# Patient Record
Sex: Female | Born: 2005
Health system: Southern US, Community
[De-identification: ages and names within clinical notes are randomized; demographics above are authoritative.]

## PROBLEM LIST (undated history)

## (undated) DIAGNOSIS — M25569 Pain in unspecified knee: Secondary | ICD-10-CM

## (undated) DIAGNOSIS — Z973 Presence of spectacles and contact lenses: Secondary | ICD-10-CM

## (undated) DIAGNOSIS — G90A Postural orthostatic tachycardia syndrome (POTS): Secondary | ICD-10-CM

## (undated) DIAGNOSIS — E739 Lactose intolerance, unspecified: Secondary | ICD-10-CM

## (undated) HISTORY — DX: Pain in unspecified knee: M25.569

## (undated) HISTORY — DX: Lactose intolerance, unspecified: E73.9

## (undated) HISTORY — DX: Presence of spectacles and contact lenses: Z97.3

## (undated) HISTORY — PX: OTHER SURGICAL HISTORY: SHX169

---
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
## 2023-02-14 ENCOUNTER — Encounter (HOSPITAL_BASED_OUTPATIENT_CLINIC_OR_DEPARTMENT_OTHER): Payer: Self-pay | Admitting: Emergency Medicine

## 2023-02-14 ENCOUNTER — Telehealth: Payer: Medicaid Other | Admitting: Physician Assistant

## 2023-02-14 ENCOUNTER — Emergency Department (HOSPITAL_BASED_OUTPATIENT_CLINIC_OR_DEPARTMENT_OTHER)
Admission: EM | Admit: 2023-02-14 | Discharge: 2023-02-14 | Disposition: A | Discharge status: Home / Self Care | Payer: Medicaid Other | Attending: Emergency Medicine | Admitting: Emergency Medicine

## 2023-02-14 ENCOUNTER — Other Ambulatory Visit (HOSPITAL_BASED_OUTPATIENT_CLINIC_OR_DEPARTMENT_OTHER): Payer: Self-pay

## 2023-02-14 ENCOUNTER — Other Ambulatory Visit: Payer: Self-pay

## 2023-02-14 ENCOUNTER — Ambulatory Visit: Payer: Self-pay | Admitting: Family Medicine

## 2023-02-14 DIAGNOSIS — L259 Unspecified contact dermatitis, unspecified cause: Secondary | ICD-10-CM | POA: Insufficient documentation

## 2023-02-14 DIAGNOSIS — J069 Acute upper respiratory infection, unspecified: Secondary | ICD-10-CM | POA: Insufficient documentation

## 2023-02-14 DIAGNOSIS — R0989 Other specified symptoms and signs involving the circulatory and respiratory systems: Secondary | ICD-10-CM

## 2023-02-14 DIAGNOSIS — Z20822 Contact with and (suspected) exposure to covid-19: Secondary | ICD-10-CM | POA: Insufficient documentation

## 2023-02-14 DIAGNOSIS — Z91010 Allergy to peanuts: Secondary | ICD-10-CM | POA: Diagnosis not present

## 2023-02-14 DIAGNOSIS — R21 Rash and other nonspecific skin eruption: Secondary | ICD-10-CM

## 2023-02-14 DIAGNOSIS — R509 Fever, unspecified: Secondary | ICD-10-CM

## 2023-02-14 LAB — GROUP A STREP BY PCR: Group A Strep by PCR: NOT DETECTED

## 2023-02-14 LAB — RESP PANEL BY RT-PCR (RSV, FLU A&B, COVID)  RVPGX2
Influenza A by PCR: NEGATIVE
Influenza B by PCR: NEGATIVE
Resp Syncytial Virus by PCR: NEGATIVE
SARS Coronavirus 2 by RT PCR: NEGATIVE

## 2023-02-14 MED ORDER — CLOBETASOL PROPIONATE 0.05 % EX CREA
1.0000 | TOPICAL_CREAM | Freq: Two times a day (BID) | CUTANEOUS | 0 refills | Status: AC
  Filled 2023-02-14: qty 30, 30d supply, fill #0

## 2023-02-14 NOTE — Discharge Instructions (Signed)
Thank you for allowing me to be a part of your care today.  You were evaluated in the ED for a rash on your hand.  I suspect that this is an eczematous contact dermatitis caused by the gloves that you use at work.  I have prescribed you a prescription ointment to use twice daily.  Throughout the day, I recommend handwashing with warm water and gentle soaps and applying a topical hand cream that creates a barrier such as Eucerin or Aquaphor.  At nighttime, you may continue to apply the Aveeno eczema lotion.  Do not apply these creams or lotions at the same time as the ointment.  Avoid hot water, chemical irritants, hand sanitizer, or wearing gloves until your skin is improved.  Follow-up with your PCP if symptoms persist.

## 2023-02-14 NOTE — ED Notes (Signed)
RT Note: Patient appears to be in no distress at this time. BBS clear and she is moving good air through out the lungs. SPO2 100% RA

## 2023-02-14 NOTE — ED Triage Notes (Signed)
Rash to right hand since christmas eve.  Pt has been using gloves with powder at work.  Pt also having some sneezing with some throat irritation.  Airway patent, no redness or irritation noted.  No swelling of the throat noted.  No known fever.

## 2023-02-14 NOTE — Telephone Encounter (Signed)
Copied from CRM [id number]. Topic: Clinical - Red Word Triage >> Feb 14, 2023 11:49 AM Rose Lang wrote: Red Word that prompted transfer to Nurse Triage: Allergic reaction on both hands, right hand is worse, PT has low-grade fever and URI symptoms  Chief Complaint: rash to top of hands Symptoms: rash, fever, pain Frequency: constant Pertinent Negatives: Patient denies sob, n/v Disposition: [] ED /[x] Urgent Care (no appt availability in office) / [] Appointment(In office/virtual)/ []  [place] [facility]/ [] Home Care/ [] Refused Recommended Disposition /[]  Mobile Bus/ []  Follow-up with PCP Additional Notes: called in with c/o rash, unknow start.  Reports feels it is an allergic reaction.  No apt available with pcp.  Patient states cannot wait until tomorrow.  Instructed to go to UC and to ER if becomes worse.   Reason for Disposition  [1] Rash is painful to touch AND [2] fever  Answer Assessment - Initial Assessment Questions 1. APPEARANCE of RASH: "Describe the rash."      Blotchy and blisters, tiny hives 2. LOCATION: "Where is the rash located?"      Both hands. On top of hand near knuckles 4. SIZE: "How big are the spots?" (Inches, centimeters or compare to size of a coin)      Pin head size 5. ONSET: "When did the rash start?"      A few days ago 6. ITCHING: "Does the rash itch?" If Yes, ask: "How bad is the itch?"  (Scale 0-10; or none, mild, moderate, severe)     Yes, itches 7. PAIN: "Does the rash hurt?" If Yes, ask: "How bad is the pain?"  (Scale 0-10; or none, mild, moderate, severe)    - NONE (0): no pain    - MILD (1-3): doesn't interfere with normal activities     - MODERATE (4-7): interferes with normal activities or awakens from sleep     - SEVERE (8-10): excruciating pain, unable to do any normal activities     7-8/10 pain and itchy 8. OTHER SYMPTOMS: "Do you have any other symptoms?" (e.g., fever)     Low grade fever; temp was 99.5  Protocols used: Rash or  Redness - Localized-A-AH

## 2023-02-14 NOTE — ED Provider Notes (Signed)
[facility] Provider Note   CSN: [id number] Arrival date & time: 02/14/23  1228     History  Chief Complaint  Patient presents with   Rash   URI    Rose Lang is a 17 y.o. female without significant past medical history presents to the ED complaining of a rash to her right hand since Christmas eve.  Patient states that she has been using vinyl gloves with powder at work.  Patient has used these clubs in the past, but has not had severe irritation like this before.  Patient has then stopped using gloves with powder, but the rash persists.  Patient has had redness and irritation on her hands previously due to glove use.  She also began having some sneezing and throat irritation that began today, but is unsure if this is related.  Patient has been using over-the-counter topical treatments including Benadryl, Aveeno eczema, Lotrimin, Aspercreme, and hydrocortisone.  Patient reports that the rash will become hot and burn at times, especially with hot water.  Denies throat swelling, facial swelling, fever.       Home Medications Prior to Admission medications   Medication Sig Start Date End Date Taking? Authorizing Provider  clobetasol ointment (TEMOVATE) 0.05 % Apply 1 Application topically 2 (two) times daily. 02/14/23  Yes Powell Halbert R, PA-C  diphenhydrAMINE (BENADRYL) 25 MG tablet Take 25 mg by mouth every 6 (six) hours as needed for itching, allergies or sleep.    [provider]  EPINEPHrine 0.3 mg/0.3 mL IJ SOAJ injection Inject 0.3 mg into the muscle as needed for anaphylaxis. 06/23/22   Hetty Blend, FNP  lactase (LACTAID) 3000 units tablet Take 3,000 Units by mouth 3 (three) times daily with meals.    [provider]  norelgestromin-ethinyl estradiol Burr Medico) 150-35 MCG/24HR transdermal patch Place 1 patch onto the skin once a week. 07/30/21   Rolm Bookbinder, CNM  ondansetron (ZOFRAN) 4 MG tablet Take one  tablet (4 mg dose) by mouth daily as needed for Nausea. Patient taking differently: Take 4 mg by mouth as needed for nausea. 07/29/21         Allergies    Shellfish allergy, Lactose, Peanut oil, and Wheat    Review of Systems   Review of Systems  Constitutional:  Negative for fever.  HENT:  Positive for sneezing. Negative for facial swelling and trouble swallowing.   Skin:  Positive for rash.    Physical Exam Updated Vital Signs BP 132/75   Pulse 82   Temp 98.4 F (36.9 C) (Oral)   Resp 16   Ht 5\' 3"  (1.6 m)   Wt 67.1 kg   LMP 01/08/2023   SpO2 100%   BMI 26.22 kg/m  Physical Exam Vitals and nursing note reviewed.  Constitutional:      General: She is not in acute distress.    Appearance: Normal appearance. She is not ill-appearing or diaphoretic.  HENT:     Nose: No congestion or rhinorrhea.  Cardiovascular:     Rate and Rhythm: Normal rate and regular rhythm.  Pulmonary:     Effort: Pulmonary effort is normal.  Skin:    General: Skin is warm and dry.     Capillary Refill: Capillary refill takes less than 2 seconds.     Findings: Rash present. Rash is macular, papular and scaling.     Comments: Dry, scaly maculopapular rash to the dorsum of the right hand mainly over the thumb  and index finger.  Rash appears eczematous in nature.  No weeping, open wounds.  Neurological:     Mental Status: She is alert. Mental status is at baseline.  Psychiatric:        Mood and Affect: Mood normal.        Behavior: Behavior normal.     ED Results / Procedures / Treatments   Labs (all labs ordered are listed, but only abnormal results are displayed) Labs Reviewed  GROUP A STREP BY PCR  RESP PANEL BY RT-PCR (RSV, FLU A&B, COVID)  RVPGX2    EKG None  Radiology No results found.  Procedures Procedures    Medications Ordered in ED Medications - No data to display  ED Course/ Medical Decision Making/ A&P                                 Medical Decision  Making  This patient presents to the ED with chief complaint(s) of rash to right hand.  The complaint involves an extensive differential diagnosis and also carries with it a high risk of complications and morbidity.    The differential diagnosis includes contact dermatitis, eczema, allergic reaction  Initial Assessment:   Exam significant for a dry, scaly maculopapular rash to the dorsum of the right hand mainly over the thumb and index finger.  She has normal ROM of all digits on this hand.  Rash appears eczematous in nature.  There is no weeping or open wounds.  No congestion or rhinorrhea on exam.  Patient is speaking in full sentences does not have any difficulty breathing.  Independent ECG/labs interpretation:  The following labs were independently interpreted:  Negative for strep pharyngitis, COVID, influenza, RSV.  Disposition:   Suspect patient has an eczematous contact dermatitis caused by glove use.  Patient has experienced irritation to her hands in the past that seems to be worsening.  Will prescribe clobetasol ointment for treatment.  Advised patient she may continue to use Aveeno eczema cream to help with symptoms.  Advised patient to also wash hands with warm soapy water, not hot, and apply hand creams or Aquaphor to create a skin barrier.  Recommended that patient not use gloves until her rash improves.  Also recommended taking antihistamine such as Zyrtec or Xyzal.  Work note provided.    The patient has been appropriately medically screened and/or stabilized in the ED. I have low suspicion for any other emergent medical condition which would require further screening, evaluation or treatment in the ED or require inpatient management. At time of discharge the patient is hemodynamically stable and in no acute distress. I have discussed work-up results and diagnosis with patient and answered all questions. Patient is agreeable with discharge plan. We discussed strict return precautions  for returning to the emergency department and they verbalized understanding.            Final Clinical Impression(s) / ED Diagnoses Final diagnoses:  Contact eczematous dermatitis    Rx / DC Orders ED Discharge Orders          Ordered    clobetasol ointment (TEMOVATE) 0.05 %  2 times daily        02/14/23 1548              Lenard Simmer, PA-C 02/14/23 1556    Charlynne Pander, MD 02/14/23 [phone]

## 2023-02-14 NOTE — ED Notes (Signed)
Patient denies pain and is resting comfortably.  

## 2023-02-14 NOTE — Progress Notes (Signed)
Because you are having URI symptoms and low grade fever with a new onset rash, I feel your condition warrants further evaluation and I recommend that you be seen in a face to face visit.   NOTE: There will be NO CHARGE for this eVisit   If you are having a true medical emergency please call 911.      For an urgent face to face visit, [facility] has eight urgent care centers for your convenience:   NEW!! [facility] Center at [facility] Get Driving Directions [phone] [address] [place], [address]    [facility] at [facility] Get Driving Directions [phone] [address] [place], [place] [address]   [facility] Health Urgent Care Center [facility]) Get Driving Directions [phone] [address] [place], [place] [address]  [facility] Health Urgent Care Center [facility]) Get Driving Directions [phone] [address] [place],  [place]  [address]  [facility] Urgent Care Center [facility] - at [employer]  [phone] [address] [place],  [place] [address]   [facility] Health Urgent Care at [facility] Get Driving Directions [phone] [address] [place], [place] [address]   [facility] Health Urgent Care at [facility] Get Driving Directions  [phone] [address] [place], [place] [address]   [facility] Health Urgent Care at [facility] Directions [phone] [address] [place], [place] [address]  Your MyChart E-visit questionnaire answers were reviewed by a board certified advanced clinical practitioner to complete your personal care plan based on your specific symptoms.  Thank you for using e-Visits.     I have spent 5 minutes in review of e-visit questionnaire, review and updating patient chart, medical decision making and response to patient.   Margaretann Loveless, PA-C

## 2023-08-17 ENCOUNTER — Encounter: Payer: Self-pay | Admitting: Family Medicine

## 2023-08-17 ENCOUNTER — Ambulatory Visit (INDEPENDENT_AMBULATORY_CARE_PROVIDER_SITE_OTHER): Payer: Self-pay | Admitting: Family Medicine

## 2023-08-17 VITALS — BP 108/70 | HR 66 | Ht 63.6 in | Wt 142.9 lb

## 2023-08-17 DIAGNOSIS — Z13220 Encounter for screening for lipoid disorders: Secondary | ICD-10-CM | POA: Diagnosis not present

## 2023-08-17 DIAGNOSIS — K582 Mixed irritable bowel syndrome: Secondary | ICD-10-CM

## 2023-08-17 DIAGNOSIS — R55 Syncope and collapse: Secondary | ICD-10-CM

## 2023-08-17 DIAGNOSIS — Z0000 Encounter for general adult medical examination without abnormal findings: Secondary | ICD-10-CM | POA: Diagnosis not present

## 2023-08-17 DIAGNOSIS — E162 Hypoglycemia, unspecified: Secondary | ICD-10-CM

## 2023-08-17 NOTE — Assessment & Plan Note (Signed)
 Well adult Orders Placed This Encounter  Procedures   CMP14+EGFR   CBC with Differential/Platelet   Lipid panel   TSH   Ambulatory referral to Gastroenterology    Referral Priority:   Routine    Referral Type:   Consultation    Referral Reason:   Specialty Services Required    Number of Visits Requested:   1  Screenings: Per lab orders Immunizations: UTD Anticipatory guidance/Risk factor reduction: Recommendations per AVS.

## 2023-08-17 NOTE — Patient Instructions (Signed)
Preventive Care 18-21 Years Old, Female Preventive care refers to lifestyle choices and visits with your health care provider that can promote health and wellness. At this stage in your life, you may start seeing a primary care physician instead of a pediatrician for your preventive care. Preventive care visits are also called wellness exams. What can I expect for my preventive care visit? Counseling During your preventive care visit, your health care provider may ask about your: Medical history, including: Past medical problems. Family medical history. Pregnancy history. Current health, including: Menstrual cycle. Method of birth control. Emotional well-being. Home life and relationship well-being. Sexual activity and sexual health. Lifestyle, including: Alcohol, nicotine or tobacco, and drug use. Access to firearms. Diet, exercise, and sleep habits. Sunscreen use. Motor vehicle safety. Physical exam Your health care provider may check your: Height and weight. These may be used to calculate your BMI (body mass index). BMI is a measurement that tells if you are at a healthy weight. Waist circumference. This measures the distance around your waistline. This measurement also tells if you are at a healthy weight and may help predict your risk of certain diseases, such as type 2 diabetes and high blood pressure. Heart rate and blood pressure. Body temperature. Skin for abnormal spots. Breasts. What immunizations do I need?  Vaccines are usually given at various ages, according to a schedule. Your health care provider will recommend vaccines for you based on your age, medical history, and lifestyle or other factors, such as travel or where you work. What tests do I need? Screening Your health care provider may recommend screening tests for certain conditions. This may include: Vision and hearing tests. Lipid and cholesterol levels. Pelvic exam and Pap test. Hepatitis B  test. Hepatitis C test. HIV (human immunodeficiency virus) test. STI (sexually transmitted infection) testing, if you are at risk. Tuberculosis skin test if you have symptoms. BRCA-related cancer screening. This may be done if you have a family history of breast, ovarian, tubal, or peritoneal cancers. Talk with your health care provider about your test results, treatment options, and if necessary, the need for more tests. Follow these instructions at home: Eating and drinking  Eat a healthy diet that includes fresh fruits and vegetables, whole grains, lean protein, and low-fat dairy products. Drink enough fluid to keep your urine pale yellow. Do not drink alcohol if: Your health care provider tells you not to drink. You are pregnant, may be pregnant, or are planning to become pregnant. You are under the legal drinking age. In the [place] the legal drinking age is 21. If you drink alcohol: Limit how much you have to 0-1 drink a day. Know how much alcohol is in your drink. In the [place] one drink equals one 12 oz bottle of beer (355 mL), one 5 oz glass of wine (148 mL), or one 1 oz glass of hard liquor (44 mL). Lifestyle Brush your teeth every morning and night with fluoride toothpaste. Floss one time each day. Exercise for at least 30 minutes 5 or more days of the week. Do not use any products that contain nicotine or tobacco. These products include cigarettes, chewing tobacco, and vaping devices, such as e-cigarettes. If you need help quitting, ask your health care provider. Do not use drugs. If you are sexually active, practice safe sex. Use a condom or other form of protection to prevent STIs. If you do not wish to become pregnant, use a form of birth control. If you plan to become pregnant,   see your health care provider for a prepregnancy visit. Find healthy ways to manage stress, such as: Meditation, yoga, or listening to music. Journaling. Talking to a trusted person. Spending time  with friends and family. Safety Always wear your seat belt while driving or riding in a vehicle. Do not drive: If you have been drinking alcohol. Do not ride with someone who has been drinking. When you are tired or distracted. While texting. If you have been using any mind-altering substances or drugs. Wear a helmet and other protective equipment during sports activities. If you have firearms in your house, make sure you follow all gun safety procedures. Seek help if you have been bullied, physically abused, or sexually abused. Use the internet responsibly to avoid dangers, such as online bullying and online sex predators. What's next? Go to your health care provider once a year for an annual wellness visit. Ask your health care provider how often you should have your eyes and teeth checked. Stay up to date on all vaccines. This information is not intended to replace advice given to you by your health care provider. Make sure you discuss any questions you have with your health care provider. Document Revised: 07/30/2020 Document Reviewed: 07/30/2020 Elsevier Patient Education  2024 Elsevier Inc.  

## 2023-08-17 NOTE — Progress Notes (Signed)
 Rose Lang - 18 y.o. female MRN [id number]  Date of birth: [birth year]  Subjective Chief Complaint  Patient presents with   Annual Exam   Irritable Bowel Syndrome   Chest Pain    HPI Rose Lang is a 18 y.o. female here today for annual exam.   History of IBS and vasovagal syncope.  Seen by cardiology in the past.    She is moderately active.  She feels that he diet is pretty good but has to avoid certain foods.   She is a  non-smoker. She denies EtOH use.   Review of Systems  Constitutional:  Negative for chills, fever, malaise/fatigue and weight loss.  HENT:  Negative for congestion, ear pain and sore throat.   Eyes:  Negative for blurred vision, double vision and pain.  Respiratory:  Negative for cough and shortness of breath.   Cardiovascular:  Negative for chest pain and palpitations.  Gastrointestinal:  Negative for abdominal pain, blood in stool, constipation, heartburn and nausea.  Genitourinary:  Negative for dysuria and urgency.  Musculoskeletal:  Negative for joint pain and myalgias.  Neurological:  Negative for dizziness and headaches.  Endo/Heme/Allergies:  Does not bruise/bleed easily.  Psychiatric/Behavioral:  Negative for depression. The patient is not nervous/anxious and does not have insomnia.     Allergies  Allergen Reactions   Shellfish Allergy Anaphylaxis   Lactose Diarrhea   Peanut Oil Swelling    Throat closed up   Wheat Nausea And Vomiting    Stomach pain    Past Medical History:  Diagnosis Date   Knee pain    Lactose intolerance    Wears glasses     Past Surgical History:  Procedure Laterality Date   NONE      Social History   Socioeconomic History   Marital status: Single    Spouse name: Not on file   Number of children: Not on file   Years of education: Not on file   Highest education level: Not on file  Occupational History   Occupation: Control and instrumentation engineer: [employer]  Tobacco Use   Smoking status: Never     Passive exposure: Never   Smokeless tobacco: Never  Vaping Use   Vaping status: Never Used  Substance and Sexual Activity   Alcohol use: No   Drug use: No   Sexual activity: Never  Other Topics Concern   Not on file  Social History Narrative   Not on file   Social Drivers of Health   Financial Resource Strain: Not on file  Food Insecurity: Not on file  Transportation Needs: Not on file  Physical Activity: Sufficiently Active (08/17/2023)   Exercise Vital Sign    Days of Exercise per Week: 5 days    Minutes of Exercise per Session: 150+ min  Stress: Not on file  Social Connections: Unknown (07/08/2021)   [facility]   Social Network    Social Network: Not on file    Family History  Problem Relation Age of Onset   Arthritis Mother    Atrial fibrillation Mother    Supraventricular tachycardia Mother    High Cholesterol Father    Heart disease Maternal Grandmother    Heart attack Maternal Grandmother    Heart disease Maternal Grandfather    Allergic rhinitis Neg Hx    Angioedema Neg Hx    Asthma Neg Hx    Eczema Neg Hx    Immunodeficiency Neg Hx    Urticaria Neg Hx  Atopy Neg Hx     Health Maintenance  Topic Date Due   HIV Screening  Never done   Meningococcal B Vaccine (1 of 2 - Standard) Never done   Hepatitis C Screening  Never done   COVID-19 Vaccine (1 - 2024-25 season) Never done   INFLUENZA VACCINE  09/16/2023   DTaP/Tdap/Td (7 - Td or Tdap) 09/25/2026   Pneumococcal Vaccine 39-72 Years old  Completed   Hepatitis B Vaccines  Completed   HPV VACCINES  Completed     ----------------------------------------------------------------------------------------------------------------------------------------------------------------------------------------------------------------- Physical Exam BP 108/70 (BP Location: Left Arm, Patient Position: Sitting, Cuff Size: Normal)   Pulse 66   Ht 5' 3.6 (1.615 m)   Wt 142 lb 14.4 oz (64.8 kg)   LMP  08/01/2023 (Exact Date)   SpO2 97%   BMI 24.84 kg/m   Physical Exam Constitutional:      General: She is not in acute distress. HENT:     Head: Normocephalic and atraumatic.     Right Ear: Tympanic membrane and ear canal normal.     Left Ear: Tympanic membrane and ear canal normal.     Nose: Nose normal.  Eyes:     General: No scleral icterus.    Conjunctiva/sclera: Conjunctivae normal.  Neck:     Thyroid: No thyromegaly.  Cardiovascular:     Rate and Rhythm: Normal rate and regular rhythm.     Heart sounds: Normal heart sounds.  Pulmonary:     Effort: Pulmonary effort is normal.     Breath sounds: Normal breath sounds.  Abdominal:     General: Bowel sounds are normal. There is no distension.     Palpations: Abdomen is soft.     Tenderness: There is no abdominal tenderness. There is no guarding.  Musculoskeletal:        General: Normal range of motion.     Cervical back: Normal range of motion and neck supple.  Lymphadenopathy:     Cervical: No cervical adenopathy.  Skin:    General: Skin is warm and dry.     Findings: No rash.  Neurological:     General: No focal deficit present.     Mental Status: She is alert and oriented to person, place, and time.     Cranial Nerves: No cranial nerve deficit.     Coordination: Coordination normal.  Psychiatric:        Mood and Affect: Mood normal.        Behavior: Behavior normal.     ------------------------------------------------------------------------------------------------------------------------------------------------------------------------------------------------------------------- Assessment and Plan  Well adult exam Well adult Orders Placed This Encounter  Procedures   CMP14+EGFR   CBC with Differential/Platelet   Lipid panel   TSH   Ambulatory referral to Gastroenterology    Referral Priority:   Routine    Referral Type:   Consultation    Referral Reason:   Specialty Services Required    Number of  Visits Requested:   1  Screenings: Per lab orders Immunizations: UTD Anticipatory guidance/Risk factor reduction: Recommendations per AVS.   Irritable bowel syndrome with both constipation and diarrhea Referral placed to GI   No orders of the defined types were placed in this encounter.   No follow-ups on file.

## 2023-08-17 NOTE — Assessment & Plan Note (Signed)
 Referral placed to GI

## 2023-08-18 LAB — CMP14+EGFR
ALT: 9 IU/L (ref 0–32)
AST: 12 IU/L (ref 0–40)
Albumin: 4.7 g/dL (ref 4.0–5.0)
Alkaline Phosphatase: 68 IU/L (ref 42–106)
BUN/Creatinine Ratio: 25 — ABNORMAL HIGH (ref 9–23)
BUN: 17 mg/dL (ref 6–20)
Bilirubin Total: 0.8 mg/dL (ref 0.0–1.2)
CO2: 19 mmol/L — ABNORMAL LOW (ref 20–29)
Calcium: 9.6 mg/dL (ref 8.7–10.2)
Chloride: 105 mmol/L (ref 96–106)
Creatinine, Ser: 0.68 mg/dL (ref 0.57–1.00)
Globulin, Total: 2.4 g/dL (ref 1.5–4.5)
Glucose: 90 mg/dL (ref 70–99)
Potassium: 4 mmol/L (ref 3.5–5.2)
Sodium: 142 mmol/L (ref 134–144)
Total Protein: 7.1 g/dL (ref 6.0–8.5)
eGFR: 128 mL/min/{1.73_m2} (ref >59)

## 2023-08-18 LAB — CBC WITH DIFFERENTIAL/PLATELET
Basophils Absolute: 0 10*3/uL (ref 0.0–0.2)
Basos: 0 %
EOS (ABSOLUTE): 0 10*3/uL (ref 0.0–0.4)
Eos: 0 %
Hematocrit: 36.1 % (ref 34.0–46.6)
Hemoglobin: 11.1 g/dL (ref 11.1–15.9)
Immature Grans (Abs): 0 10*3/uL (ref 0.0–0.1)
Immature Granulocytes: 0 %
Lymphocytes Absolute: 1.1 10*3/uL (ref 0.7–3.1)
Lymphs: 20 %
MCH: 26.1 pg — ABNORMAL LOW (ref 26.6–33.0)
MCHC: 30.7 g/dL — ABNORMAL LOW (ref 31.5–35.7)
MCV: 85 fL (ref 79–97)
Monocytes Absolute: 0.5 10*3/uL (ref 0.1–0.9)
Monocytes: 8 %
Neutrophils Absolute: 3.9 10*3/uL (ref 1.4–7.0)
Neutrophils: 72 %
Platelets: 226 10*3/uL (ref 150–450)
RBC: 4.25 x10E6/uL (ref 3.77–5.28)
RDW: 13.1 % (ref 11.7–15.4)
WBC: 5.5 10*3/uL (ref 3.4–10.8)

## 2023-08-18 LAB — LIPID PANEL
Chol/HDL Ratio: 2 ratio (ref 0.0–4.4)
Cholesterol, Total: 133 mg/dL (ref 100–199)
HDL: 66 mg/dL (ref >39)
LDL Chol Calc (NIH): 56 mg/dL (ref 0–99)
Triglycerides: 44 mg/dL (ref 0–149)
VLDL Cholesterol Cal: 11 mg/dL (ref 5–40)

## 2023-08-18 LAB — TSH: TSH: 0.953 u[IU]/mL (ref 0.450–4.500)

## 2023-08-24 ENCOUNTER — Ambulatory Visit: Payer: Self-pay | Admitting: Family Medicine

## 2023-08-26 ENCOUNTER — Emergency Department (HOSPITAL_BASED_OUTPATIENT_CLINIC_OR_DEPARTMENT_OTHER): Payer: Self-pay

## 2023-08-26 ENCOUNTER — Emergency Department (HOSPITAL_BASED_OUTPATIENT_CLINIC_OR_DEPARTMENT_OTHER)
Admission: EM | Admit: 2023-08-26 | Discharge: 2023-08-26 | Disposition: A | Discharge status: Home / Self Care | Payer: Self-pay | Attending: Emergency Medicine | Admitting: Emergency Medicine

## 2023-08-26 ENCOUNTER — Other Ambulatory Visit: Payer: Self-pay

## 2023-08-26 ENCOUNTER — Encounter (HOSPITAL_BASED_OUTPATIENT_CLINIC_OR_DEPARTMENT_OTHER): Payer: Self-pay

## 2023-08-26 DIAGNOSIS — R519 Headache, unspecified: Secondary | ICD-10-CM | POA: Insufficient documentation

## 2023-08-26 DIAGNOSIS — H5710 Ocular pain, unspecified eye: Secondary | ICD-10-CM | POA: Diagnosis not present

## 2023-08-26 DIAGNOSIS — Y92410 Unspecified street and highway as the place of occurrence of the external cause: Secondary | ICD-10-CM | POA: Diagnosis not present

## 2023-08-26 DIAGNOSIS — M25572 Pain in left ankle and joints of left foot: Secondary | ICD-10-CM | POA: Diagnosis not present

## 2023-08-26 DIAGNOSIS — Z91010 Allergy to peanuts: Secondary | ICD-10-CM | POA: Diagnosis not present

## 2023-08-26 DIAGNOSIS — M25562 Pain in left knee: Secondary | ICD-10-CM | POA: Diagnosis not present

## 2023-08-26 DIAGNOSIS — M7918 Myalgia, other site: Secondary | ICD-10-CM

## 2023-08-26 HISTORY — DX: Postural orthostatic tachycardia syndrome (POTS): G90.A

## 2023-08-26 MED ORDER — LIDOCAINE 5 % EX PTCH
2.0000 | MEDICATED_PATCH | CUTANEOUS | 0 refills | Status: AC
Start: 2023-08-26 — End: ?

## 2023-08-26 MED ORDER — OXYCODONE-ACETAMINOPHEN 5-325 MG PO TABS
1.0000 | ORAL_TABLET | Freq: Once | ORAL | Status: AC
  Administered 2023-08-26: 1 via ORAL
  Filled 2023-08-26: qty 1

## 2023-08-26 MED ORDER — METAXALONE 800 MG PO TABS: 800.0000 mg | ORAL_TABLET | Freq: Three times a day (TID) | ORAL | 0 refills | Status: AC
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
## 2023-08-26 NOTE — ED Notes (Signed)
 Pt advised ankle is beginning to hurt her. Pt ambulated and can bear weight, but tenderness upon assessment.
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
## 2023-08-28 ENCOUNTER — Encounter: Payer: Self-pay | Admitting: Family Medicine

## 2023-08-28 DIAGNOSIS — S8992XD Unspecified injury of left lower leg, subsequent encounter: Secondary | ICD-10-CM

## 2023-08-28 DIAGNOSIS — S99912D Unspecified injury of left ankle, subsequent encounter: Secondary | ICD-10-CM

## 2023-08-29 ENCOUNTER — Encounter: Payer: Self-pay | Admitting: Medical-Surgical

## 2023-08-29 ENCOUNTER — Telehealth: Payer: Self-pay

## 2023-08-29 ENCOUNTER — Other Ambulatory Visit (HOSPITAL_BASED_OUTPATIENT_CLINIC_OR_DEPARTMENT_OTHER): Payer: Self-pay

## 2023-08-29 ENCOUNTER — Ambulatory Visit: Payer: Self-pay | Admitting: *Deleted

## 2023-08-29 ENCOUNTER — Ambulatory Visit (INDEPENDENT_AMBULATORY_CARE_PROVIDER_SITE_OTHER): Payer: Self-pay | Admitting: Medical-Surgical

## 2023-08-29 DIAGNOSIS — T1592XD Foreign body on external eye, part unspecified, left eye, subsequent encounter: Secondary | ICD-10-CM

## 2023-08-29 DIAGNOSIS — R42 Dizziness and giddiness: Secondary | ICD-10-CM

## 2023-08-29 MED ORDER — METHOCARBAMOL 500 MG PO TABS
500.0000 mg | ORAL_TABLET | Freq: Three times a day (TID) | ORAL | 1 refills | Status: AC
  Filled 2023-08-29: qty 90, 30d supply, fill #0

## 2023-08-29 MED ORDER — MECLIZINE HCL 25 MG PO TABS
25.0000 mg | ORAL_TABLET | Freq: Three times a day (TID) | ORAL | 1 refills | Status: AC | PRN
  Filled 2023-08-29: qty 30, 10d supply, fill #0
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
## 2023-08-29 NOTE — Telephone Encounter (Signed)
 Copied from CRM [id number]. Topic: General - Other >> Aug 29, 2023 12:01 PM Alfonso ORN wrote: Reason for CRM: patient returning Suzen call she received today , patient called at lunch time please contact patient  Patient call back number is [phone]

## 2023-08-29 NOTE — Telephone Encounter (Signed)
 Patient scheduled today with Christen Butter, NP

## 2023-08-29 NOTE — Telephone Encounter (Signed)
 Patient is scheduled for today 08/29/23.
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
## 2023-08-29 NOTE — Telephone Encounter (Signed)
 Attempted call to patient to assist her in scheduling a hospital visit follow up with Zada Willo PIETY today  as Dr. Alvia is out of the office. Left a voice mail message requesting a return call.

## 2023-08-29 NOTE — Patient Instructions (Signed)
Post-Concussion Syndrome  A concussion is a brain injury from a direct hit to the head or body. This hit causes the brain to shake back and forth fast inside the skull. The shaking can damage brain cells and cause chemical changes in the brain. Concussions are normally not life-threatening but can cause serious symptoms. Post-concussion syndrome is when symptoms that happen after a concussion last longer than normal. These symptoms can last from weeks to months. What are the causes? The cause of this condition is not known. It can happen whether your head injury was mild or severe. What increases the risk? You are more likely to get this condition if: You are female. You are a child, teen, or young adult. You have had a head injury before. You have a history of headaches. You have depression or anxiety. You have more than one symptom or severe symptoms when your concussion occurs. You faint or cannot remember the event (have amnesia of the event). What are the signs or symptoms? Symptoms of this condition include: Physical symptoms, such as: Headaches. Tiredness. Dizziness and weakness. Blurred vision and sensitivity to light. Trouble hearing. Problems with balance. Mental and emotional symptoms, such as: Memory problems and trouble focusing. Trouble falling asleep or staying asleep (insomnia). Feeling irritable. Anxiety or depression. Trouble learning new things. How is this diagnosed? This condition may be diagnosed based on: Your symptoms. A description of your injury. Your medical history. Testing your strength, balance, and nerve function (neurological exam). Your health care provider may order other tests. These may include: Brain imaging, such as a CT scan or an MRI. Memory testing (neuropsychological testing). How is this treated? Treatment for this condition may depend on your symptoms. Symptoms normally go away on their own with time. If you need treatment, it may  include: Medicines for headaches, anxiety, depression, and insomnia. Resting your brain and body for a few days after your injury. Rehab therapy, such as: Physical or occupational therapy. This may include exercises to help with balance and dizziness. Mental health counseling. A form of talk therapy called cognitive behavioral therapy (CBT) can be especially helpful. This therapy helps you set goals and follow up on the changes that you make. Speech therapy. Vision therapy. A brain and eye specialist can recommend treatments for vision problems. Follow these instructions at home: Medicines Take over-the-counter and prescription medicines only as told by your health care provider. Avoid opioid prescription pain medicines when getting over a concussion. Activity Limit your mental activities for the first few days after your injury. This may include not doing these things: Homework or work for your job. Complex thinking. Watching TV. Using a computer or phone. Playing memory games and puzzles. Slowly return to your normal activity level. If a certain activity brings on your symptoms, stop or slow down until you can do the activity without getting symptoms again. Limit physical activity, such as sports or strenuous activities, for the first few days after a concussion. Slowly return to normal activity as told by your health care provider. Light exercise may be helpful. Rest helps your brain heal. Make sure you: Get plenty of sleep at night. Most adults should get at least 7-9 hours of sleep each night. Rest during the day. Take naps or rest breaks when you feel tired. Do not do high-risk activities that could cause a second concussion, such as riding a bike or playing sports. Having another concussion before the first one has healed can be harmful. General instructions  Do  not drink alcohol until your health care provider says that you can. Keep track of how severe your symptoms are and how  often they happen. Give this information to your health care provider. Keep all follow-up visits. Your health care provider may need to check you for new or serious symptoms. Where to find more information [employer]: concussionfoundation.org Contact a health care provider if: Your symptoms do not improve. You get injured again. You have unusual behavior changes. Get help right away if: You have a severe or worsening headache. You have weakness or numbness in any part of your body. You vomit repeatedly. You have mental status changes, such as: Confusion. Trouble speaking. Trouble staying awake. Fainting. You have a seizure. These symptoms may be an emergency. Get help right away. Call 911. Do not wait to see if the symptoms will go away. Do not drive yourself to the hospital. Also, get help right away if: You think about hurting yourself or others. Take one of these steps if you feel like you may hurt yourself or others, or have thoughts about taking your own life: Go to your nearest emergency room. Call 911. Call the National Suicide Prevention Lifeline at 410-806-6033 or 988. This is open 24 hours a day. Text the Crisis Text Line at 443-649-6263. This information is not intended to replace advice given to you by your health care provider. Make sure you discuss any questions you have with your health care provider. Document Revised: 06/26/2021 Document Reviewed: 06/26/2021 Elsevier Patient Education  2024 [employer].

## 2023-08-30 ENCOUNTER — Other Ambulatory Visit (HOSPITAL_BASED_OUTPATIENT_CLINIC_OR_DEPARTMENT_OTHER): Payer: Self-pay

## 2023-09-08 NOTE — Telephone Encounter (Signed)
 Called patient and gave her contact information for referral so she could call and check on scheduling an appt.

## 2023-09-19 ENCOUNTER — Ambulatory Visit: Admitting: Family Medicine

## 2023-10-04 ENCOUNTER — Other Ambulatory Visit (HOSPITAL_BASED_OUTPATIENT_CLINIC_OR_DEPARTMENT_OTHER): Payer: Self-pay

## 2023-10-04 MED ORDER — MELOXICAM 15 MG PO TABS
15.0000 mg | ORAL_TABLET | Freq: Every day | ORAL | 0 refills | Status: AC
  Filled 2023-10-04: qty 30, 30d supply, fill #0

## 2023-10-05 ENCOUNTER — Ambulatory Visit (INDEPENDENT_AMBULATORY_CARE_PROVIDER_SITE_OTHER): Payer: Self-pay | Admitting: Family Medicine

## 2023-10-05 ENCOUNTER — Encounter: Payer: Self-pay | Admitting: Family Medicine

## 2023-10-05 VITALS — BP 109/62 | HR 72 | Ht 63.5 in | Wt 145.0 lb

## 2023-10-05 DIAGNOSIS — M79605 Pain in left leg: Secondary | ICD-10-CM | POA: Diagnosis not present

## 2023-10-05 DIAGNOSIS — F0781 Postconcussional syndrome: Secondary | ICD-10-CM

## 2023-10-05 NOTE — Assessment & Plan Note (Signed)
Referral entered for physical therapy
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
## 2023-10-05 NOTE — Assessment & Plan Note (Signed)
 Postconcussive symptoms have essentially resolved at this point.

## 2023-10-12 ENCOUNTER — Ambulatory Visit: Admitting: Physical Therapy

## 2023-10-19 ENCOUNTER — Ambulatory Visit: Admitting: Physical Therapy

## 2023-11-14 ENCOUNTER — Encounter: Payer: Self-pay | Admitting: Family Medicine
